# Patient Record
Sex: Male | Born: 1970 | Race: White | Hispanic: No | Marital: Married | Smoking: Never smoker
Health system: Southern US, Community
[De-identification: ages and names within clinical notes are randomized; demographics above are authoritative.]

## PROBLEM LIST (undated history)

## (undated) DIAGNOSIS — M1990 Unspecified osteoarthritis, unspecified site: Secondary | ICD-10-CM

## (undated) DIAGNOSIS — N200 Calculus of kidney: Secondary | ICD-10-CM

## (undated) DIAGNOSIS — T4145XA Adverse effect of unspecified anesthetic, initial encounter: Secondary | ICD-10-CM

---

## 2003-10-06 ENCOUNTER — Inpatient Hospital Stay (HOSPITAL_COMMUNITY): Admission: RE | Admit: 2003-10-06 | Discharge: 2003-10-08 | Payer: Self-pay | Admitting: Orthopaedic Surgery

## 2005-03-28 IMAGING — RF DG KNEE 1-2V*R*
1 series · 1 of 1 positions shown · non-contrast
Comparison: none

CLINICAL DATA: Tibial osteotomy with plate graft.
 RIGHT KNEE
 A single C-arm spot film shows an osteotomy has been performed of the proximal right tibia with plate and screw fixation in good position.
 IMPRESSION
 Proximal right tibial osteotomy with fixation.

[Series 1: run · 1 of 1 slices shown]
[im 1/1]
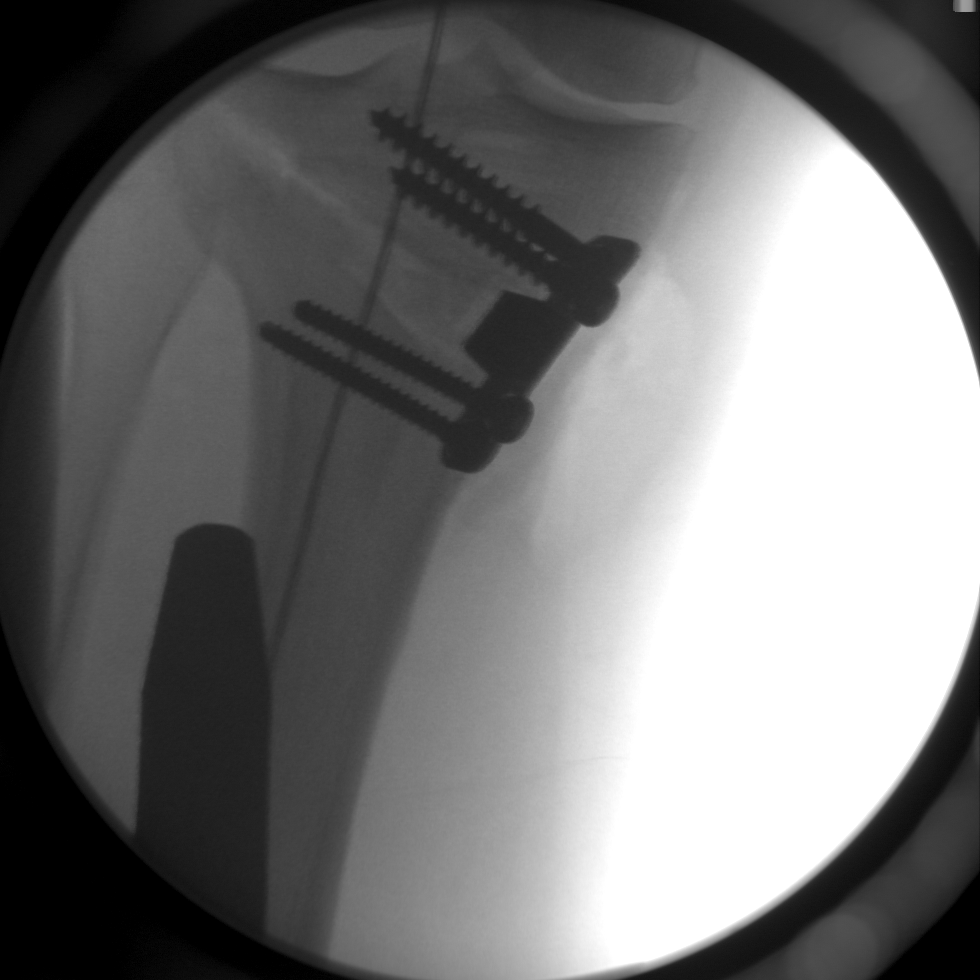

[1 of 1 positions shown; findings below may reference images not displayed]

## 2011-01-01 ENCOUNTER — Other Ambulatory Visit: Payer: Self-pay | Admitting: Orthopedic Surgery

## 2011-01-01 ENCOUNTER — Encounter (HOSPITAL_COMMUNITY): Payer: Worker's Compensation

## 2011-01-01 LAB — URINALYSIS, ROUTINE W REFLEX MICROSCOPIC
Ketones, ur: NEGATIVE mg/dL
Leukocytes, UA: NEGATIVE
Nitrite: NEGATIVE
Protein, ur: NEGATIVE mg/dL
Specific Gravity, Urine: 1.022 (ref 1.005–1.030)

## 2011-01-01 LAB — BASIC METABOLIC PANEL
Calcium: 9.9 mg/dL (ref 8.4–10.5)
Chloride: 101 mEq/L (ref 96–112)
GFR calc Af Amer: >60 mL/min (ref >60)
GFR calc non Af Amer: >60 mL/min (ref >60)
Glucose, Bld: 95 mg/dL (ref 70–99)
Sodium: 137 mEq/L (ref 135–145)

## 2011-01-01 LAB — CBC
HCT: 47 % (ref 39.0–52.0)
Hemoglobin: 16 g/dL (ref 13.0–17.0)
MCH: 29.4 pg (ref 26.0–34.0)
Platelets: 227 10*3/uL (ref 150–400)
RBC: 5.45 MIL/uL (ref 4.22–5.81)
RDW: 13.7 % (ref 11.5–15.5)
WBC: 9.1 10*3/uL (ref 4.0–10.5)

## 2011-01-01 LAB — PROTIME-INR
INR: 0.87 (ref 0.00–1.49)
Prothrombin Time: 12 seconds (ref 11.6–15.2)

## 2011-01-01 LAB — DIFFERENTIAL
Basophils Relative: 0 % (ref 0–1)
Eosinophils Relative: 2 % (ref 0–5)
Lymphocytes Relative: 32 % (ref 12–46)
Lymphs Abs: 2.9 10*3/uL (ref 0.7–4.0)
Monocytes Absolute: 0.5 10*3/uL (ref 0.1–1.0)

## 2011-01-01 LAB — SURGICAL PCR SCREEN: MRSA, PCR: NEGATIVE

## 2011-01-04 NOTE — H&P (Signed)
NAMEKYLEY, LAUREL NO.:  [id number]  MEDICAL RECORD NO.:  [id number]  LOCATION:                                 FACILITY:  PHYSICIAN:  Madlyn Frankel. Charlann Boxer, M.D.  DATE OF BIRTH:  14-Jan-1971  DATE OF ADMISSION: DATE OF DISCHARGE:                             HISTORY & PHYSICAL   DIAGNOSIS:  Right knee osteoarthritis.  HISTORY OF PRESENT ILLNESS:  The patient is a 40 year old white male who states he has had right knee pain for about 3 years.  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  Since that time, he has had significant pain, which has just increased over the years.  The patient does state that he did have previous surgeries on that knee with multiple arthroscopies and wedge is placed in his knee, but he was doing okay after surgeries until the time of the fall.  At the time of the fall, he has had 3-4 arthroscopies, all of which have been minimally effective.  Steroid injections and hyaluronic acid injections also, which were minimally effective and helping the pain.  The patient also states that he has had braces and physical therapy, which all were ineffective.  Various options were discussed with the patient.  The patient wished to proceed with surgery. Risks, benefits, and expectations of the procedure were discussed with the patient.  The patient understands the risks, benefits, and expectations, and wishes to proceed with a right total knee arthroplasty.  The patient is a candidate for tranexamic acid.  The patient has not been given his postop medications and these will be given to him at the time of discharge.  PAST MEDICAL HISTORY:  The patient denies any past medical history.  PAST SURGICAL HISTORY: 1. Surgery to remove kidney stones. 2. Multiple surgeries on his right knee.  MEDICATIONS:  The patient denies taking any medications.  ALLERGIES:  The patient denies any drug allergies.   The patient does, however, had an allergy to PEANUTS.  SOCIAL HISTORY:  The patient denies the use of alcohol or tobacco.  REVIEW OF SYSTEMS:  The patient does complain of joint pain, joint swelling, muscle pain, and morning stiffness, otherwise unremarkable.  PHYSICAL EXAMINATION:  GENERAL:  The patient is a 40 year old white male in no acute distress. VITAL SIGNS:  Stable.  Blood pressure 130/86, respiration 16, pulse 80. HEENT:  Pupils equal, round, and reactive to light and accommodation. Throat is clear. NECK:  Supple.  No JVD.  No carotid bruits.  No lymphadenopathy. CARDIAC:  Normal appearing S1 and S2.  No murmur appreciated. RESPIRATORY:  Clear to auscultation bilaterally. NEURO:  The patient is oriented x3. ORTHO:  Pertaining to the right knee, the patient does have generalized right knee pain with palpation.  The patient does have pain with range of motion.  The patient has had full range of motion, but this is painful.  The patient has a +2 dorsalis pedis pulse.  The patient is distally neurovascularly intact.  Surgical site looks clear.  IMPRESSION:  Right knee osteoarthritis.  STUDIES:  X-rays of the right knee reveal  significant osteoarthritis of the right knee in all compartments.  PLAN:  The patient admitted to the hospital to undergo a right total knee arthroplasty.  Risks, benefits, and expectations of the procedure were discussed with the patient.  The patient understands the risks, benefits, and expectations and wishes to proceed.    ______________________________ Lanney Gins, PA   ______________________________ Madlyn Frankel. Charlann Boxer, M.D.    MB/MEDQ  D:  01/01/2011  T:  01/02/2011  Job:  [id number]  Electronically Signed by Lanney Gins PA on 01/02/2011 03:58:26 PM Electronically Signed by Durene Romans M.D. on 01/04/2011 09:21:49 AM

## 2011-01-14 ENCOUNTER — Inpatient Hospital Stay (HOSPITAL_COMMUNITY)
Admission: RE | Admit: 2011-01-14 | Discharge: 2011-01-16 | DRG: 470 | Disposition: A | Discharge status: Home Health | Payer: Worker's Compensation | Source: Ambulatory Visit | Attending: Orthopedic Surgery | Admitting: Orthopedic Surgery

## 2011-01-14 DIAGNOSIS — Z87442 Personal history of urinary calculi: Secondary | ICD-10-CM

## 2011-01-14 DIAGNOSIS — Z01812 Encounter for preprocedural laboratory examination: Secondary | ICD-10-CM

## 2011-01-14 DIAGNOSIS — Z91010 Allergy to peanuts: Secondary | ICD-10-CM

## 2011-01-14 DIAGNOSIS — G478 Other sleep disorders: Secondary | ICD-10-CM | POA: Diagnosis not present

## 2011-01-14 DIAGNOSIS — M1710 Unilateral primary osteoarthritis, unspecified knee: Principal | ICD-10-CM | POA: Diagnosis present

## 2011-01-15 LAB — CBC
HCT: 38.4 % — ABNORMAL LOW (ref 39.0–52.0)
Hemoglobin: 12.6 g/dL — ABNORMAL LOW (ref 13.0–17.0)
MCH: 29 pg (ref 26.0–34.0)
MCV: 88.3 fL (ref 78.0–100.0)
Platelets: 180 10*3/uL (ref 150–400)
RBC: 4.35 MIL/uL (ref 4.22–5.81)
RDW: 13.5 % (ref 11.5–15.5)
WBC: 9.2 10*3/uL (ref 4.0–10.5)

## 2011-01-15 LAB — BASIC METABOLIC PANEL
BUN: 10 mg/dL (ref 6–23)
GFR calc Af Amer: >60 mL/min (ref >60)
GFR calc non Af Amer: >60 mL/min (ref >60)
Glucose, Bld: 103 mg/dL — ABNORMAL HIGH (ref 70–99)

## 2011-01-15 NOTE — Op Note (Signed)
NAMERHETT, MUTSCHLER NO.:  [id number]  MEDICAL RECORD NO.:  [id number]  LOCATION:  1611                         FACILITY:  [facility]  PHYSICIAN:  Dennis Frankel. Charlann Arellano, M.D.  DATE OF BIRTH:  [birth year]  DATE OF PROCEDURE:  01/14/2011 DATE OF DISCHARGE:                              OPERATIVE REPORT   PREOPERATIVE DIAGNOSIS:  Right knee osteoarthritis.  POSTOPERATIVE DIAGNOSIS:  Right knee osteoarthritis.  PROCEDURE:  Right total knee replacement utilizing DePuy component size #4 narrow femur, #3 tibia, 10 mm insert, and 38 patellar button.  SURGEON:  Dennis Frankel. Charlann Arellano, M.D.  ASSISTANT:  Lanney Gins, PA-C  ANESTHESIA:  General plus a preoperative regional femoral nerve blocks.  COMPLICATIONS:  None.  DRAINS:  One Hemovac.  SPECIMENS:  None.  TOURNIQUET TIME:  42 minutes with 250 mmHg.  INDICATIONS:  Dennis Arellano is a 40 year old male patient of mine since he injured his knee from a workers' comp standpoint.  He has undergone, atthis point, three unsuccessful surgeries of arthroscopic scar debridement and then assessed knee as well as removal of hardware.  He has had persistent recurring problems with his knee that has limited his overall ability to function and perform his job, but also to deal with life.  At this point, based on his persistence and recurrence of problems, he wished to proceed with more definitive measures, knee replacement versus partial knee replacement discussed and he wished to proceed with more definitive measures of treatment, total knee replacement.  Risks and benefits were discussed and the risk of infection, DVT, component failure, need for revision surgery were all discussed, particularly with his age and activity level.  Consent was obtained for benefit of pain relief.  PROCEDURE IN DETAIL:  The patient was brought to operative theater. Once adequate anesthesia, preoperative antibiotics, Ancef administered, the patient was placed in  supine with the  right thigh tourniquet placed.  The right lower extremity was then prepped and draped in sterile fashion.  His right foot placed in leg holder.  The leg was exsanguinated, tourniquet elevated 250 mmHg.  A midline incision was made followed by median arthrotomy.  Following initial exposure and debridement, attention was first directed to patella, precut measure was noted to be 26 mm, was taken down to 15 mm.  Used a 38 patellar button to restore height and cover the cut surface best. Leg holes were drilled and metal shim placed.  Attention was now directed to femur, femoral canals were opened with a drill, irrigated to try to prevent fat emboli.  The intramedullary rod was passed at 3 degrees of valgus, 10 mm bone resected off the distal femur.  Following this resection, the proximal tibia was subluxated anteriorly.  I removed remaining meniscus medially.  The lateral meniscus and cruciate stump using extramedullary guide and measured resection of 10 mm of the proximal lateral tibia was carried out.  The basis of the cut based on previous opening wedge medial osteotomy.  Following this cut, we confirmed the gap was stable medially and laterally with 10 mm insert as well as confirmed that the cut was perpendicular in the coronal plane, checking the alignment rod.  Once  those two sides were done, I sized the femur, to be a size #4 from anterior-posterior dimension.  The size #4 block was then pinned in position anterior reference due to the sequence of rotation of proximal tibial cut.  The four-in-one cutting block was then pinned into position and anterior- posterior chamfer cuts made without difficulty nor notching.  The final box cut was made off the lateral aspect of the distal femur.  Once this was done and synovectomy performed in the anterior aspect of the knee, as I was removing this bone fragments, attention was now directed to tibia.  The tibia subluxated  anteriorly and a size #3 tibial tray seemed to fit best on the medial osteophytes and little bit of bony overgrowth.  The size #3 trial was then pinned into position, drilled and keel punched.  Trial reduction now carried out with 4 narrow femur, 3 tibia, and a 10-mm insert.  With this the knee came to full extension and was stable from extension to flexion using the 10 insert.  The patella tracked through the trochlea withou tapplicatyion of pressure.  Given all these findings, the trial components were removed.  The final components were opened.  The knee was irrigated with normal saline solution and pulse lavaged.  The synovial capsule junction knee was injected with 0.25% Marcaine with Epinephrine, 1 mL of Toradol, total of 61 mL.  Final components were then cemented and completely dried, cut surfaces of bone.  The knee was brought to extension with 10 mL insert.  The knee was held in extension until the cement fully cured.  Excessive cement was removed. Once cement had fully cured and excess cement was removed throughout the knee, the final 10 mm insert was chosen and placed in the knee.  The tourniquet was let down after 42 minutes with excellent hemostasis.  At this point, Hemovac drain was placed deep.  We re-irrigated the knee. We reapproximated the extensor mechanism using #1 Vicryl with knee in flexion.  The remaining wound was closed with 2-0 Vicryl and running 4-0 Monocryl.  The knee was cleaned, dried, dressed sterilely using Dermabond and after sterile dressing, drain site dressed separately. The knee was wrapped in Ace wrap.  He was then brought to recovery room, extubated in stable condition, tolerating the procedure well.     Dennis Arellano, M.D.     MDO/MEDQ  D:  01/14/2011  T:  01/15/2011  Job:  [id number]  Electronically Signed by Durene Romans M.D. on 01/15/2011 01:54:10 PM

## 2011-01-16 LAB — CBC
HCT: 37 % — ABNORMAL LOW (ref 39.0–52.0)
MCHC: 33.2 g/dL (ref 30.0–36.0)
MCV: 87.7 fL (ref 78.0–100.0)
Platelets: 163 10*3/uL (ref 150–400)
RBC: 4.22 MIL/uL (ref 4.22–5.81)
WBC: 8.1 10*3/uL (ref 4.0–10.5)

## 2011-01-16 LAB — BASIC METABOLIC PANEL
CO2: 28 mEq/L (ref 19–32)
Calcium: 8.7 mg/dL (ref 8.4–10.5)
Creatinine, Ser: 0.82 mg/dL (ref 0.50–1.35)
GFR calc Af Amer: >60 mL/min (ref >60)

## 2011-01-17 NOTE — Discharge Summary (Signed)
NAMEZAYDENN, BALAGUER NO.:  [id number]  MEDICAL RECORD NO.:  [id number]  LOCATION:  1611                         FACILITY:  [facility]  PHYSICIAN:  Madlyn Frankel. Charlann Boxer, M.D.  DATE OF BIRTH:  1971/04/24  DATE OF ADMISSION:  01/14/2011 DATE OF DISCHARGE:  01/16/2011                              DISCHARGE SUMMARY   PROCEDURE:  Right total knee arthroplasty.  ADMITTING DIAGNOSIS:  Right knee osteoarthritis.  DISCHARGE DIAGNOSIS:  Status post right total knee arthroplasty.  HISTORY OF PRESENT ILLNESS:  The patient is a 40 year old white male, who states he has had right knee pain for about 3 years.  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  Since that time, he has significant pain, which has just increased over the years.  The patient does state that he did have previous surgeries on that knee with multiple arthroscopies in a wedge is placed in his knee.  He was doing okay after surgery until the time of the fall.  Since the fall, he has had 3 or 4 arthroscopies, all of which have been minimal effective in controlling the pain.  The patient has had steroid injection, hyaluronic acid injections as well, which again have only been minimally effective in helping the pain.  The patient has used braces and had physical therapy in the past, all of which have not helped.  Various options were discussed with the patient. The patient wishes to proceed with surgery.  Risks, benefits, and expectations of the procedure have been discussed and thus the patient understands the risks, benefits and expectations  and wished to proceed with surgery.  HOSPITAL COURSE:  The patient underwent the above-stated procedure on January 14, 2011.  The patient tolerated the procedure well, was brought to the recovery room in good condition and subsequently to the floor.  Postop day 1, January 15, 2011, the patient is doing well.  No  real incidences.  The patient did have difficulty sleeping.  The patient also did have a hard time with the pain, which he was given some Toradol on top of his pain medications for.  The patient is afebrile, Vital signs are stable.  H and H 12.6/38.4 .  He is distally neurovascularly intact. Dressings clean, dry and intact.  Hemovac was removed.  The patient had physical therapy.  On postop day 2, January 16, 2011, the patient was much better at night, sleeping well.  Pain is controlled well, was afebrile, vital signs stable.  H and H is 12.3/37.0.  He is distally neurovascularly intact. The Ace was removed.  Dressing is clean, dry, and intact.  Did physical therapy.  The patient was felt to be doing well enough to be discharged home.  DISCHARGE CONDITION:  Good.  DISCHARGE INSTRUCTIONS:  The patient will be discharged home on January 16, 2011.  He will be weightbearing as tolerated.  The patient maintained surgical dressing for 8 days after which he will replace the dressing with gauze and tape.  He will keep the area dry and clean until followup.  The patient will follow up with Dr.  Treyce Spillers at [facility] in 2 weeks.  The patient is to call if any concerns or questions.  DISCHARGE MEDICATIONS: 1. Aspirin enteric-coated 325 mg one p.o. b.i.d. x4 weeks. 2. Celebrex 200 mg one p.o. b.i.d. a month. 3. Benadryl 25 mg one p.o. q.4 h. p.r.n. 4. Colace 100 mg one p.o. b.i.d. p.r.n. constipation. 5. Iron sulfate 325 mg one p.o. t.i.d. 6. Norco 7.5/325 one or two p.o. q.4-6 h. p.r.n. pain. 7. MiraLax 17 g p.o. q. day p.r.n. constipation. 8. Robaxin 500 mg one p.o. q.6 h.    ______________________________ Lanney Gins, PA   ______________________________ Madlyn Frankel. Charlann Boxer, M.D.    MB/MEDQ  D:  01/16/2011  T:  01/16/2011  Job:  [id number]  Electronically Signed by Lanney Gins PA on 01/16/2011 05:29:32 PM Electronically Signed by Durene Romans M.D. on 01/17/2011 08:58:56  PM

## 2011-06-05 ENCOUNTER — Encounter (HOSPITAL_COMMUNITY): Payer: Self-pay | Admitting: Pharmacy Technician

## 2011-06-19 ENCOUNTER — Encounter (HOSPITAL_COMMUNITY): Payer: Self-pay

## 2011-06-19 ENCOUNTER — Encounter (HOSPITAL_COMMUNITY)
Admission: RE | Admit: 2011-06-19 | Discharge: 2011-06-19 | Disposition: A | Discharge status: Home / Self Care | Payer: Worker's Compensation | Source: Ambulatory Visit | Attending: Orthopedic Surgery | Admitting: Orthopedic Surgery

## 2011-06-19 DIAGNOSIS — M1990 Unspecified osteoarthritis, unspecified site: Secondary | ICD-10-CM

## 2011-06-19 DIAGNOSIS — T8859XA Other complications of anesthesia, initial encounter: Secondary | ICD-10-CM

## 2011-06-19 DIAGNOSIS — N200 Calculus of kidney: Secondary | ICD-10-CM

## 2011-06-19 HISTORY — DX: Other complications of anesthesia, initial encounter: T88.59XA

## 2011-06-19 HISTORY — DX: Adverse effect of unspecified anesthetic, initial encounter: T41.45XA

## 2011-06-19 HISTORY — PX: KNEE ARTHROSCOPY: SHX127

## 2011-06-19 HISTORY — PX: JOINT REPLACEMENT: SHX530

## 2011-06-19 HISTORY — DX: Calculus of kidney: N20.0

## 2011-06-19 HISTORY — DX: Unspecified osteoarthritis, unspecified site: M19.90

## 2011-06-19 HISTORY — PX: WISDOM TOOTH EXTRACTION: SHX21

## 2011-06-19 LAB — BASIC METABOLIC PANEL
BUN: 10 mg/dL (ref 6–23)
CO2: 27 mEq/L (ref 19–32)
Chloride: 100 mEq/L (ref 96–112)
GFR calc Af Amer: >90 mL/min (ref >90)
Glucose, Bld: 99 mg/dL (ref 70–99)
Potassium: 4.3 mEq/L (ref 3.5–5.1)

## 2011-06-19 LAB — CBC
HCT: 46.5 % (ref 39.0–52.0)
Hemoglobin: 15.9 g/dL (ref 13.0–17.0)
MCH: 28.6 pg (ref 26.0–34.0)
MCHC: 34.2 g/dL (ref 30.0–36.0)
MCV: 83.8 fL (ref 78.0–100.0)

## 2011-06-19 LAB — DIFFERENTIAL
Basophils Relative: 0 % (ref 0–1)
Eosinophils Relative: 3 % (ref 0–5)
Monocytes Absolute: 0.4 10*3/uL (ref 0.1–1.0)
Monocytes Relative: 4 % (ref 3–12)
Neutro Abs: 5.2 10*3/uL (ref 1.7–7.7)

## 2011-06-19 LAB — URINALYSIS, ROUTINE W REFLEX MICROSCOPIC
Glucose, UA: NEGATIVE mg/dL
Ketones, ur: NEGATIVE mg/dL
Leukocytes, UA: NEGATIVE
pH: 7 (ref 5.0–8.0)

## 2011-06-19 LAB — SURGICAL PCR SCREEN: MRSA, PCR: NEGATIVE

## 2011-06-19 MED ORDER — CHLORHEXIDINE GLUCONATE 4 % EX LIQD
60.0000 mL | Freq: Once | CUTANEOUS | Status: DC
  Filled 2011-06-19: qty 60

## 2011-06-19 NOTE — Patient Instructions (Signed)
20 VITTORIO MOHS  06/19/2011   Your procedure is scheduled on: 06-23-11  Report to [facility] at 0745 AM.  Call this number if you have problems the morning of surgery: [phone]   Remember:   Do not eat food:After Midnight.  May have clear liquids:until Midnight .  Clear liquids include soda, tea, black coffee, apple or grape juice, broth.  Take these medicines the morning of surgery with A SIP OF WATER: none   Do not wear jewelry, make-up or nail polish.  Do not wear lotions, powders, or perfumes. You may wear deodorant.  Do not shave 48 hours prior to surgery.  Do not bring valuables to the hospital.  Contacts, dentures or bridgework may not be worn into surgery.  Leave suitcase in the car. After surgery it may be brought to your room.  For patients admitted to the hospital, checkout time is 11:00 AM the day of discharge.   Patients discharged the day of surgery will not be allowed to drive home.  Name and phone number of your driver: [phone]  Special Instructions: Incentive Spirometry - Practice and bring it with you on the day of surgery. and CHG Shower Use Special Wash: 1/2 bottle night before surgery and 1/2 bottle morning of surgery.   Please read over the following fact sheets that you were given: Blood Transfusion Information and MRSA Information

## 2011-06-19 NOTE — H&P (Signed)
Dennis Arellano is an 41 y.o. male.    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  Since that time, he has had significant pain, which has just  increased over the years.  The patient does state that he did have previous surgeries on that knee with multiple arthroscopies and wedge placement in his knee.  After the time of the fall, he has had 3-4 arthroscopies, all of which have been minimally effective.  Steroid injections and hyaluronic acid injections also, which were minimally effective at helping the pain.  The patient also states that he has had braces and physical therapy.  Since all the conservative treatements were ineffective he had a right total knee arthroplasty a couple of month ago. He was doing well at first, but gradually started to have a mechanical click in the knee and started to have some pain on the lateral aspect. Various options were  discussed with the patient.  The patient wished to proceed with surgery.  Risks, benefits, and expectations of the procedure were discussed with  the patient.  The patient understands the risks, benefits, and expectations, and wishes to proceed with a right total knee revision, with open arthrotomy, poly exchange and scar removal/debridment.  PCP:  No primary provider on file.  D/C Plans:  Home with HHPT  Post-op Meds:  Rx given for ASA, Robaxin, Iron, Colace and MiraLax  Tranexamic Acid:  To be given  PMH: Past Medical History  Diagnosis Date  . Complication of anesthesia 06-19-11    8'12 surgery -hard time to arouse  . Kidney calculi 06-19-11    past hx.  . Arthritis 06-19-11    8'12 RTKA, now with  ?scarring    PSH: Past Surgical History  Procedure Date  . Joint replacement 06-19-11    8'12 RTKA, multiple knee  scopes prior  . Knee arthroscopy 06-19-11    Right -multiple ,one with bone wedge procedure  . Wisdom tooth extraction 06-19-11    Social History:  reports that he has never smoked. He does not have any smokeless tobacco history on file. He reports that he does not drink alcohol or use illicit drugs.  Allergies:  Allergies  Allergen Reactions  . Peanut-Containing Drug Products Anaphylaxis    Medications: None   ROS: Review of Systems  Constitutional: Negative.   HENT: Negative.   Eyes: Negative.   Respiratory: Negative.   Cardiovascular: Negative.   Gastrointestinal: Negative.   Genitourinary: Negative.   Musculoskeletal: Positive for joint pain (right knee).  Skin: Negative.   Neurological: Negative.   Endo/Heme/Allergies: Negative.   Psychiatric/Behavioral: Negative.      Physican Exam: Blood pressure 136/80, respiration 16, pulse 80. Physical Exam  Constitutional: He is oriented to person, place, and time and well-developed, well-nourished, and in no distress.  HENT:  Head: Normocephalic and atraumatic.  Nose: Nose normal.  Mouth/Throat: Oropharynx is clear and moist.  Eyes: Pupils are equal, round, and reactive to light.  Neck: Normal range of motion. Neck supple. No JVD present. No tracheal deviation present. No thyromegaly present.  Cardiovascular: Normal rate, regular rhythm, normal heart sounds and intact distal pulses.  Exam reveals no gallop and no friction rub.   No murmur heard. Pulmonary/Chest: Effort normal  and breath sounds normal. No stridor. No respiratory distress. He has no wheezes. He has no rales. He exhibits no tenderness.  Abdominal: Soft. There is no tenderness. There is no guarding.  Musculoskeletal:       Right knee: He exhibits laceration (healed from previous surgery). He exhibits normal range of motion, no swelling, no effusion, no ecchymosis, no deformity, no erythema, normal alignment and no LCL laxity. tenderness found. Lateral joint  line tenderness noted. No patellar tendon tenderness noted.  Lymphadenopathy:    He has no cervical adenopathy.  Neurological: He is alert and oriented to person, place, and time.  Skin: Skin is warm and dry. No rash noted. No erythema. No pallor.  Psychiatric: Affect normal.    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 Risks benefits and expectation were discussed with the patient. Patient understand risks, benefits and expectation and wishes to proceed.   Anastasio Auerbach Dennis Arellano   PAC  06/22/2011, 10:27 AM

## 2011-06-19 NOTE — Pre-Procedure Instructions (Signed)
06-19-11 CT Chest (07-19-10) report with chart.

## 2011-06-23 ENCOUNTER — Inpatient Hospital Stay (HOSPITAL_COMMUNITY)
Admission: RE | Admit: 2011-06-23 | Discharge: 2011-06-25 | DRG: 489 | Disposition: A | Discharge status: Home Health | Payer: Worker's Compensation | Source: Ambulatory Visit | Attending: Orthopedic Surgery | Admitting: Orthopedic Surgery

## 2011-06-23 ENCOUNTER — Encounter (HOSPITAL_COMMUNITY): Payer: Self-pay | Admitting: Anesthesiology

## 2011-06-23 ENCOUNTER — Encounter (HOSPITAL_COMMUNITY): Payer: Self-pay | Admitting: *Deleted

## 2011-06-23 ENCOUNTER — Encounter (HOSPITAL_COMMUNITY)
Admission: RE | Disposition: A | Discharge status: Home Health | Payer: Self-pay | Source: Ambulatory Visit | Attending: Orthopedic Surgery

## 2011-06-23 ENCOUNTER — Inpatient Hospital Stay (HOSPITAL_COMMUNITY): Payer: Worker's Compensation | Admitting: Anesthesiology

## 2011-06-23 DIAGNOSIS — Z96659 Presence of unspecified artificial knee joint: Secondary | ICD-10-CM

## 2011-06-23 DIAGNOSIS — Y831 Surgical operation with implant of artificial internal device as the cause of abnormal reaction of the patient, or of later complication, without mention of misadventure at the time of the procedure: Secondary | ICD-10-CM | POA: Diagnosis present

## 2011-06-23 DIAGNOSIS — M23302 Other meniscus derangements, unspecified lateral meniscus, unspecified knee: Secondary | ICD-10-CM | POA: Diagnosis present

## 2011-06-23 DIAGNOSIS — T84039A Mechanical loosening of unspecified internal prosthetic joint, initial encounter: Principal | ICD-10-CM | POA: Diagnosis present

## 2011-06-23 DIAGNOSIS — Z91010 Allergy to peanuts: Secondary | ICD-10-CM

## 2011-06-23 DIAGNOSIS — Z01812 Encounter for preprocedural laboratory examination: Secondary | ICD-10-CM

## 2011-06-23 HISTORY — PX: I&D KNEE WITH POLY EXCHANGE: SHX5024

## 2011-06-23 LAB — TYPE AND SCREEN: ABO/RH(D): A POS

## 2011-06-23 SURGERY — IRRIGATION AND DEBRIDEMENT KNEE WITH POLY EXCHANGE
Anesthesia: General | Site: Knee | Laterality: Right | Wound class: Clean

## 2011-06-23 MED ORDER — ROCURONIUM BROMIDE 100 MG/10ML IV SOLN
INTRAVENOUS | Status: DC | PRN
  Administered 2011-06-23: 50 mg via INTRAVENOUS

## 2011-06-23 MED ORDER — ACETAMINOPHEN 650 MG RE SUPP: 650.0000 mg | Freq: Four times a day (QID) | RECTAL | Status: DC | PRN

## 2011-06-23 MED ORDER — TRANEXAMIC ACID 100 MG/ML IV SOLN
1860.0000 mg | INTRAVENOUS | Status: AC
  Administered 2011-06-23: 10:00:00 via INTRAVENOUS
  Administered 2011-06-23: 1850 mg via INTRAVENOUS
  Filled 2011-06-23: qty 18.6

## 2011-06-23 MED ORDER — PHENOL 1.4 % MT LIQD: 1.0000 | OROMUCOSAL | Status: DC | PRN

## 2011-06-23 MED ORDER — METOCLOPRAMIDE HCL 10 MG PO TABS: 5.0000 mg | ORAL_TABLET | Freq: Three times a day (TID) | ORAL | Status: DC | PRN

## 2011-06-23 MED ORDER — HYDROMORPHONE HCL PF 1 MG/ML IJ SOLN: 0.2500 mg | INTRAMUSCULAR | Status: DC | PRN

## 2011-06-23 MED ORDER — HYDROMORPHONE HCL PF 1 MG/ML IJ SOLN
0.5000 mg | INTRAMUSCULAR | Status: DC | PRN
  Administered 2011-06-23: 0.5 mg via INTRAVENOUS
  Administered 2011-06-23: 1 mg via INTRAVENOUS
  Administered 2011-06-23: 0.5 mg via INTRAVENOUS
  Administered 2011-06-24: 1 mg via INTRAVENOUS
  Filled 2011-06-23 (×5): qty 1

## 2011-06-23 MED ORDER — METHOCARBAMOL 500 MG PO TABS
500.0000 mg | ORAL_TABLET | Freq: Four times a day (QID) | ORAL | Status: DC | PRN
  Administered 2011-06-24 (×3): 500 mg via ORAL
  Filled 2011-06-23 (×3): qty 1

## 2011-06-23 MED ORDER — ONDANSETRON HCL 4 MG/2ML IJ SOLN
INTRAMUSCULAR | Status: DC | PRN
  Administered 2011-06-23: 4 mg via INTRAVENOUS

## 2011-06-23 MED ORDER — CEFAZOLIN SODIUM-DEXTROSE 2-3 GM-% IV SOLR
2.0000 g | Freq: Once | INTRAVENOUS | Status: AC
  Administered 2011-06-23: 2 g via INTRAVENOUS

## 2011-06-23 MED ORDER — CEFAZOLIN SODIUM 1-5 GM-% IV SOLN
1.0000 g | Freq: Four times a day (QID) | INTRAVENOUS | Status: AC
  Administered 2011-06-23 – 2011-06-24 (×3): 1 g via INTRAVENOUS
  Filled 2011-06-23 (×3): qty 50

## 2011-06-23 MED ORDER — PROPOFOL 10 MG/ML IV BOLUS
INTRAVENOUS | Status: DC | PRN
  Administered 2011-06-23: 230 mg via INTRAVENOUS
  Administered 2011-06-23: 50 mg via INTRAVENOUS

## 2011-06-23 MED ORDER — KETOROLAC TROMETHAMINE 30 MG/ML IJ SOLN
INTRAMUSCULAR | Status: DC | PRN
  Administered 2011-06-23: 30 mg via INTRAVENOUS

## 2011-06-23 MED ORDER — LACTATED RINGERS IV SOLN
INTRAVENOUS | Status: DC
  Administered 2011-06-23: 100 mL/h via INTRAVENOUS
  Administered 2011-06-23: 1000 mL via INTRAVENOUS

## 2011-06-23 MED ORDER — LIDOCAINE HCL (CARDIAC) 20 MG/ML IV SOLN
INTRAVENOUS | Status: DC | PRN
  Administered 2011-06-23: 50 mg via INTRAVENOUS

## 2011-06-23 MED ORDER — ONDANSETRON HCL 4 MG PO TABS: 4.0000 mg | ORAL_TABLET | Freq: Four times a day (QID) | ORAL | Status: DC | PRN

## 2011-06-23 MED ORDER — ACETAMINOPHEN 10 MG/ML IV SOLN
INTRAVENOUS | Status: DC | PRN
  Administered 2011-06-23: 1000 mg via INTRAVENOUS

## 2011-06-23 MED ORDER — GLYCOPYRROLATE 0.2 MG/ML IJ SOLN
INTRAMUSCULAR | Status: DC | PRN
  Administered 2011-06-23: .5 mg via INTRAVENOUS

## 2011-06-23 MED ORDER — ONDANSETRON HCL 4 MG/2ML IJ SOLN: 4.0000 mg | Freq: Four times a day (QID) | INTRAMUSCULAR | Status: DC | PRN

## 2011-06-23 MED ORDER — LACTATED RINGERS IV SOLN
INTRAVENOUS | Status: DC | PRN
  Administered 2011-06-23 (×2): via INTRAVENOUS

## 2011-06-23 MED ORDER — MENTHOL 3 MG MT LOZG
1.0000 | LOZENGE | OROMUCOSAL | Status: DC | PRN
  Filled 2011-06-23: qty 9

## 2011-06-23 MED ORDER — HYDROCODONE-ACETAMINOPHEN 7.5-325 MG PO TABS
1.0000 | ORAL_TABLET | ORAL | Status: DC | PRN
  Administered 2011-06-23: 2 via ORAL
  Administered 2011-06-23 – 2011-06-25 (×3): 1 via ORAL
  Filled 2011-06-23: qty 2
  Filled 2011-06-23 (×3): qty 1

## 2011-06-23 MED ORDER — FENTANYL CITRATE 0.05 MG/ML IJ SOLN
50.0000 ug | INTRAMUSCULAR | Status: DC | PRN
  Administered 2011-06-23: 100 ug via INTRAVENOUS

## 2011-06-23 MED ORDER — DEXAMETHASONE SODIUM PHOSPHATE 10 MG/ML IJ SOLN
10.0000 mg | Freq: Once | INTRAMUSCULAR | Status: DC
  Filled 2011-06-23: qty 1

## 2011-06-23 MED ORDER — BUPIVACAINE-EPINEPHRINE PF 0.25-1:200000 % IJ SOLN
INTRAMUSCULAR | Status: DC | PRN
  Administered 2011-06-23: 30 mL

## 2011-06-23 MED ORDER — ALUM & MAG HYDROXIDE-SIMETH 200-200-20 MG/5ML PO SUSP: 30.0000 mL | ORAL | Status: DC | PRN

## 2011-06-23 MED ORDER — ACETAMINOPHEN 325 MG PO TABS: 650.0000 mg | ORAL_TABLET | Freq: Four times a day (QID) | ORAL | Status: DC | PRN

## 2011-06-23 MED ORDER — PROMETHAZINE HCL 25 MG/ML IJ SOLN: 6.2500 mg | INTRAMUSCULAR | Status: DC | PRN

## 2011-06-23 MED ORDER — ROPIVACAINE HCL 5 MG/ML IJ SOLN
INTRAMUSCULAR | Status: DC | PRN
  Administered 2011-06-23: 25 mL via EPIDURAL

## 2011-06-23 MED ORDER — SUFENTANIL CITRATE 50 MCG/ML IV SOLN
INTRAVENOUS | Status: DC | PRN
  Administered 2011-06-23 (×4): 10 ug via INTRAVENOUS

## 2011-06-23 MED ORDER — RIVAROXABAN 10 MG PO TABS
10.0000 mg | ORAL_TABLET | Freq: Every day | ORAL | Status: DC
  Administered 2011-06-24 – 2011-06-25 (×2): 10 mg via ORAL
  Filled 2011-06-23 (×2): qty 1

## 2011-06-23 MED ORDER — METHOCARBAMOL 100 MG/ML IJ SOLN
500.0000 mg | Freq: Four times a day (QID) | INTRAVENOUS | Status: DC | PRN
  Administered 2011-06-23: 500 mg via INTRAVENOUS
  Filled 2011-06-23: qty 5

## 2011-06-23 MED ORDER — NEOSTIGMINE METHYLSULFATE 1 MG/ML IJ SOLN
INTRAMUSCULAR | Status: DC | PRN
  Administered 2011-06-23: 4 mg via INTRAVENOUS

## 2011-06-23 MED ORDER — MIDAZOLAM HCL 2 MG/2ML IJ SOLN
1.0000 mg | INTRAMUSCULAR | Status: DC | PRN
  Administered 2011-06-23: 2 mg via INTRAVENOUS

## 2011-06-23 MED ORDER — METOCLOPRAMIDE HCL 5 MG/ML IJ SOLN: 5.0000 mg | Freq: Three times a day (TID) | INTRAMUSCULAR | Status: DC | PRN

## 2011-06-23 MED ORDER — POLYETHYLENE GLYCOL 3350 17 G PO PACK
17.0000 g | PACK | Freq: Every day | ORAL | Status: DC | PRN
  Filled 2011-06-23: qty 1

## 2011-06-23 MED ORDER — SODIUM CHLORIDE 0.9 % IV SOLN
INTRAVENOUS | Status: DC
  Administered 2011-06-23 – 2011-06-24 (×2): via INTRAVENOUS
  Filled 2011-06-23 (×7): qty 1000

## 2011-06-23 MED ORDER — DOCUSATE SODIUM 100 MG PO CAPS
100.0000 mg | ORAL_CAPSULE | Freq: Two times a day (BID) | ORAL | Status: DC
  Administered 2011-06-23 – 2011-06-25 (×4): 100 mg via ORAL
  Filled 2011-06-23 (×5): qty 1

## 2011-06-23 MED ORDER — DIPHENHYDRAMINE HCL 12.5 MG/5ML PO ELIX: 12.5000 mg | ORAL_SOLUTION | ORAL | Status: DC | PRN

## 2011-06-23 SURGICAL SUPPLY — 71 items
BAG ZIPLOCK 12X15 (MISCELLANEOUS) ×2 IMPLANT
BANDAGE ELASTIC 4 VELCRO ST LF (GAUZE/BANDAGES/DRESSINGS) ×2 IMPLANT
BANDAGE ELASTIC 6 VELCRO ST LF (GAUZE/BANDAGES/DRESSINGS) ×2 IMPLANT
BANDAGE ESMARK 6X9 LF (GAUZE/BANDAGES/DRESSINGS) ×1 IMPLANT
BLADE SAW SGTL 13.0X1.19X90.0M (BLADE) ×2 IMPLANT
BLADE SAW SGTL 81X20 HD (BLADE) ×2 IMPLANT
BNDG ESMARK 6X9 LF (GAUZE/BANDAGES/DRESSINGS) ×2
BONE CEMENT GENTAMICIN (Cement) ×2 IMPLANT
CEMENT BONE GENTAMICIN 40 (Cement) ×1 IMPLANT
CEMENT RESTRICTOR DEPUY SZ 4 (Cement) ×2 IMPLANT
CLOTH BEACON ORANGE TIMEOUT ST (SAFETY) ×2 IMPLANT
CONT SPECI 4OZ STER CLIK (MISCELLANEOUS) ×2 IMPLANT
CUFF TOURN SGL QUICK 34 (TOURNIQUET CUFF) ×1
CUFF TRNQT CYL 34X4X40X1 (TOURNIQUET CUFF) ×1 IMPLANT
DECANTER SPIKE VIAL GLASS SM (MISCELLANEOUS) ×2 IMPLANT
DERMABOND ADVANCED (GAUZE/BANDAGES/DRESSINGS) ×1
DERMABOND ADVANCED .7 DNX12 (GAUZE/BANDAGES/DRESSINGS) ×1 IMPLANT
DRAPE EXTREMITY T 121X128X90 (DRAPE) ×2 IMPLANT
DRAPE POUCH INSTRU U-SHP 10X18 (DRAPES) ×2 IMPLANT
DRAPE U-SHAPE 47X51 STRL (DRAPES) ×2 IMPLANT
DRSG ADAPTIC 3X8 NADH LF (GAUZE/BANDAGES/DRESSINGS) ×2 IMPLANT
DRSG AQUACEL AG ADV 3.5X10 (GAUZE/BANDAGES/DRESSINGS) ×2 IMPLANT
DRSG PAD ABDOMINAL 8X10 ST (GAUZE/BANDAGES/DRESSINGS) ×4 IMPLANT
DRSG TEGADERM 4X4.75 (GAUZE/BANDAGES/DRESSINGS) ×2 IMPLANT
DURAPREP 26ML APPLICATOR (WOUND CARE) ×2 IMPLANT
ELECT REM PT RETURN 9FT ADLT (ELECTROSURGICAL) ×2
ELECTRODE REM PT RTRN 9FT ADLT (ELECTROSURGICAL) ×1 IMPLANT
EVACUATOR 1/8 PVC DRAIN (DRAIN) ×2 IMPLANT
EVACUATOR SILICONE 100CC (DRAIN) IMPLANT
FACESHIELD LNG OPTICON STERILE (SAFETY) ×10 IMPLANT
FLOSEAL (HEMOSTASIS) ×2 IMPLANT
GAUZE SPONGE 2X2 8PLY STRL LF (GAUZE/BANDAGES/DRESSINGS) ×1 IMPLANT
GLOVE BIOGEL PI IND STRL 7.5 (GLOVE) ×1 IMPLANT
GLOVE BIOGEL PI IND STRL 8 (GLOVE) ×1 IMPLANT
GLOVE BIOGEL PI INDICATOR 7.5 (GLOVE) ×1
GLOVE BIOGEL PI INDICATOR 8 (GLOVE) ×1
GLOVE ECLIPSE 8.0 STRL XLNG CF (GLOVE) IMPLANT
GLOVE ORTHO TXT STRL SZ7.5 (GLOVE) ×4 IMPLANT
GLOVE SURG ORTHO 8.0 STRL STRW (GLOVE) ×2 IMPLANT
GOWN STRL NON-REIN LRG LVL3 (GOWN DISPOSABLE) ×2 IMPLANT
HANDPIECE INTERPULSE COAX TIP (DISPOSABLE) ×1
IMMOBILIZER KNEE 20 (SOFTGOODS)
IMMOBILIZER KNEE 20 THIGH 36 (SOFTGOODS) IMPLANT
KIT BASIN OR (CUSTOM PROCEDURE TRAY) ×2 IMPLANT
MANIFOLD NEPTUNE II (INSTRUMENTS) ×2 IMPLANT
NDL SAFETY ECLIPSE 18X1.5 (NEEDLE) ×1 IMPLANT
NEEDLE HYPO 18GX1.5 SHARP (NEEDLE) ×1
NS IRRIG 1000ML POUR BTL (IV SOLUTION) ×4 IMPLANT
PACK TOTAL JOINT (CUSTOM PROCEDURE TRAY) ×2 IMPLANT
PADDING CAST COTTON 6X4 STRL (CAST SUPPLIES) ×4 IMPLANT
PLATE ROT INSERT 15MM SIZE 4 (Plate) ×2 IMPLANT
POSITIONER SURGICAL ARM (MISCELLANEOUS) ×2 IMPLANT
SET HNDPC FAN SPRY TIP SCT (DISPOSABLE) ×1 IMPLANT
SPONGE GAUZE 2X2 STER 10/PKG (GAUZE/BANDAGES/DRESSINGS) ×1
SPONGE GAUZE 4X4 12PLY (GAUZE/BANDAGES/DRESSINGS) ×4 IMPLANT
SPONGE LAP 18X18 X RAY DECT (DISPOSABLE) IMPLANT
SUCTION FRAZIER 12FR DISP (SUCTIONS) ×2 IMPLANT
SUT ETHILON 2 0 PS N (SUTURE) IMPLANT
SUT MNCRL AB 4-0 PS2 18 (SUTURE) ×2 IMPLANT
SUT VIC AB 1 CT1 36 (SUTURE) ×6 IMPLANT
SUT VIC AB 2-0 CT1 27 (SUTURE) ×3
SUT VIC AB 2-0 CT1 TAPERPNT 27 (SUTURE) ×3 IMPLANT
SWAB COLLECTION DEVICE MRSA (MISCELLANEOUS) IMPLANT
SYR 50ML LL SCALE MARK (SYRINGE) ×2 IMPLANT
SYR CONTROL 10ML LL (SYRINGE) ×2 IMPLANT
TOWEL OR 17X26 10 PK STRL BLUE (TOWEL DISPOSABLE) ×4 IMPLANT
TRAY FOLEY CATH 14FRSI W/METER (CATHETERS) ×2 IMPLANT
TRAY REVISION SZ 3 (Knees) ×2 IMPLANT
TUBE ANAEROBIC SPECIMEN COL (MISCELLANEOUS) ×2 IMPLANT
WATER STERILE IRR 1500ML POUR (IV SOLUTION) ×2 IMPLANT
WRAP KNEE MAXI GEL POST OP (GAUZE/BANDAGES/DRESSINGS) ×2 IMPLANT

## 2011-06-23 NOTE — Anesthesia Procedure Notes (Signed)
Anesthesia Regional Block:  Femoral nerve block  Pre-Anesthetic Checklist: ,, timeout performed, Correct Patient, Correct Site, Correct Laterality, Correct Procedure, Correct Position, site marked, Risks and benefits discussed,  Surgical consent,  Pre-op evaluation,  At surgeon's request and post-op pain management  Laterality: Right  Prep: chloraprep       Needles:   Needle Type: Echogenic Stimulator Needle     Needle Length: 10cm 10 cm Needle Gauge: 21 G    Additional Needles:  Procedures: ultrasound guided Femoral nerve block Narrative:  Start time: 06/23/2011 9:06 AM End time: 06/23/2011 9:16 AM  Performed by: Personally   Femoral nerve block

## 2011-06-23 NOTE — Transfer of Care (Signed)
Immediate Anesthesia Transfer of Care Note  Patient: Dennis Arellano  Procedure(s) Performed:  IRRIGATION AND DEBRIDEMENT KNEE WITH POLY EXCHANGE - Right Knee Open Arthrotomy and Scar Removal/Debridement/Lateral Aspect of Knee with Possible Poly Exchange and Tibial revision tray.  Patient Location: PACU  Anesthesia Type: General  Level of Consciousness: awake, alert , oriented and patient cooperative  Airway & Oxygen Therapy: Patient Spontanous Breathing and Patient connected to nasal cannula oxygen  Post-op Assessment: Report given to PACU RN, Post -op Vital signs reviewed and stable and Patient moving all extremities  Post vital signs: Reviewed and stable  Complications: No apparent anesthesia complications

## 2011-06-23 NOTE — Brief Op Note (Signed)
06/23/2011  11:42 AM  PATIENT:  Dennis Arellano  41 y.o. male  PRE-OPERATIVE DIAGNOSIS:  Right Total Knee Arthroplasty Scar Tissue  POST-OPERATIVE DIAGNOSIS:  Loose tibial tray found at debridement, with excessive scar noted in infrapatellar region and lateral gutter  PROCEDURE:  Procedure(s): Revision Right TKR with scar debridement  SURGEON:  Surgeon(s): Shelda Pal, MD  PHYSICIAN ASSISTANT: Lanney Gins, PA-C   ANESTHESIA:   regional and general  EBL:  Total I/O In: 1000 [I.V.:1000] Out: 350 [Urine:250; Blood:100]  BLOOD ADMINISTERED:none  DRAINS: (1 medium) Hemovact drain(s) in the right knee with  Suction Open   LOCAL MEDICATIONS USED:  MARCAINE 30CC and OTHER toradol, 1 cc  SPECIMEN:  No Specimen  DISPOSITION OF SPECIMEN:  N/A  COUNTS:  YES  TOURNIQUET:   Total Tourniquet Time Documented: Thigh (Right) - 33 minutes  DICTATION: .Other Dictation: Dictation Number [id number]  PLAN OF CARE: Admit to inpatient   PATIENT DISPOSITION:  PACU - hemodynamically stable.   Delay start of Pharmacological VTE agent (>24hrs) due to surgical blood loss or risk of bleeding:  {YES/NO/NOT APPLICABLE:20182

## 2011-06-23 NOTE — Anesthesia Preprocedure Evaluation (Signed)
Anesthesia Evaluation  Patient identified by MRN, date of birth, ID band Patient awake    Reviewed: Allergy & Precautions, H&P , NPO status , Patient's Chart, lab work & pertinent test results, reviewed documented beta blocker date and time   Airway Mallampati: II TM Distance: >3 FB Neck ROM: Full    Dental  (+) Dental Advisory Given   Pulmonary neg pulmonary ROS,  clear to auscultation        Cardiovascular neg cardio ROS Regular Normal Denies cardiac symptoms   Neuro/Psych Negative Neurological ROS  Negative Psych ROS   GI/Hepatic negative GI ROS, Neg liver ROS,   Endo/Other  Morbid obesity  Renal/GU negative Renal ROS  Genitourinary negative   Musculoskeletal negative musculoskeletal ROS (+)   Abdominal   Peds negative pediatric ROS (+)  Hematology negative hematology ROS (+)   Anesthesia Other Findings   Reproductive/Obstetrics negative OB ROS                           Anesthesia Physical Anesthesia Plan  ASA: II  Anesthesia Plan: General   Post-op Pain Management:    Induction: Intravenous  Airway Management Planned: Oral ETT  Additional Equipment:   Intra-op Plan:   Post-operative Plan: Extubation in OR  Informed Consent: I have reviewed the patients History and Physical, chart, labs and discussed the procedure including the risks, benefits and alternatives for the proposed anesthesia with the patient or authorized representative who has indicated his/her understanding and acceptance.     Plan Discussed with: CRNA and Surgeon  Anesthesia Plan Comments:         Anesthesia Quick Evaluation

## 2011-06-23 NOTE — Interval H&P Note (Signed)
History and Physical Interval Note:  06/23/2011 9:07 AM  Dennis Arellano  has presented today for surgery, with the diagnosis of Right Total Knee Arthroplasty Scar Tissue  The various methods of treatment have been discussed with the patient and family. After consideration of risks, benefits and other options for treatment, the patient has consented to  Procedure(s): RIGHT KNEE SCAR DEBRIDEMENT PluS IRRIGATION AND DEBRIDEMENT KNEE WITH POLY EXCHANGE as a surgical intervention .  The patients' history has been reviewed, patient examined, no change in status, stable for surgery.  I have reviewed the patients' chart and labs.  Questions were answered to the patient's satisfaction.     Shelda Pal

## 2011-06-23 NOTE — Anesthesia Postprocedure Evaluation (Signed)
  Anesthesia Post-op Note  Patient: Dennis Arellano  Procedure(s) Performed:  IRRIGATION AND DEBRIDEMENT KNEE WITH POLY EXCHANGE - Right Knee Open Arthrotomy and Scar Removal/Debridement/Lateral Aspect of Knee with Possible Poly Exchange and Tibial revision tray.  Patient Location: PACU  Anesthesia Type: General and GA combined with regional for post-op pain  Level of Consciousness: oriented and sedated  Airway and Oxygen Therapy: Patient Spontanous Breathing and Patient connected to nasal cannula oxygen  Post-op Pain: mild  Post-op Assessment: Post-op Vital signs reviewed, Patient's Cardiovascular Status Stable, Respiratory Function Stable, Patent Airway and No signs of Nausea or vomiting  Post-op Vital Signs: stable  Complications: No apparent anesthesia complications

## 2011-06-24 LAB — CBC
Hemoglobin: 12.8 g/dL — ABNORMAL LOW (ref 13.0–17.0)
MCH: 28 pg (ref 26.0–34.0)
Platelets: 199 10*3/uL (ref 150–400)
RBC: 4.57 MIL/uL (ref 4.22–5.81)
WBC: 9.3 10*3/uL (ref 4.0–10.5)

## 2011-06-24 LAB — BASIC METABOLIC PANEL
CO2: 29 mEq/L (ref 19–32)
Calcium: 8.7 mg/dL (ref 8.4–10.5)
Glucose, Bld: 103 mg/dL — ABNORMAL HIGH (ref 70–99)
Potassium: 4.6 mEq/L (ref 3.5–5.1)
Sodium: 134 mEq/L — ABNORMAL LOW (ref 135–145)

## 2011-06-24 MED ORDER — OXYCODONE HCL 5 MG PO TABS
5.0000 mg | ORAL_TABLET | ORAL | Status: DC
  Administered 2011-06-24 – 2011-06-25 (×12): 15 mg via ORAL
  Filled 2011-06-24 (×12): qty 3

## 2011-06-24 MED ORDER — KETOROLAC TROMETHAMINE 15 MG/ML IJ SOLN
15.0000 mg | Freq: Four times a day (QID) | INTRAMUSCULAR | Status: DC
  Administered 2011-06-24 – 2011-06-25 (×2): 15 mg via INTRAVENOUS
  Filled 2011-06-24 (×6): qty 1

## 2011-06-24 NOTE — Progress Notes (Signed)
Subjective: 1 Day Post-Op Procedure(s) (LRB): IRRIGATION AND DEBRIDEMENT KNEE WITH POLY EXCHANGE (Right)   Patient reports pain as mild. No events.  Objective:   VITALS:   Filed Vitals:   06/24/11 0630  BP: 116/77  Pulse: 74  Temp: 98.1 F (36.7 C)  Resp: 16    Neurovascular intact Dorsiflexion/Plantar flexion intact Incision: dressing C/D/I No cellulitis present Compartment soft  LABS  Basename 06/24/11 0400  HGB 12.8*  HCT 38.6*  WBC 9.3  PLT 199     Basename 06/24/11 0400  NA 134*  K 4.6  BUN 11  CREATININE 0.96  GLUCOSE 103*     Assessment/Plan: 1 Day Post-Op Procedure(s) (LRB): IRRIGATION AND DEBRIDEMENT KNEE WITH POLY EXCHANGE (Right)  Foley cath d/c'ed HV drain d/c'ed Advance diet Up with therapy D/C IV fluids Discharge home with home health upon discharge   Anastasio Auerbach. Kniyah Khun   PAC  06/24/2011, 8:31 AM

## 2011-06-24 NOTE — Op Note (Signed)
Dennis Arellano, Dennis Arellano  MEDICAL RECORD NO.:  [id number]  LOCATION:  1619                         FACILITY:  [facility]  PHYSICIAN:  Dennis Arellano, M.D.  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  POSTOPERATIVE DIAGNOSES/FINDINGS:  Excessive scarring of the infrapatellar region with some associated lateral scarring, however, at the time of debridement the tibial component was noted to have separated quite easily from its cement mantle.  PROCEDURE: 1. Revision right total knee replacement with revision of the tibial     tray to an M.B.T. revision tray size 3 with a 15 mm insert to match     the 4 femur. 2. Sharp excisional debridement of the knee including lateral gutter     scarring in infrapatellar region.  SURGEON:  Dennis Frankel. Charlann Boxer, MD  ASSISTANT:  Dennis Gins, PA-C  Mr. Dennis Gins, PA, was present for the entirety of the case, utilized for preoperative positioning, perioperative retractor and leg management as well as the facilitation of case and primary wound closure.  ANESTHESIA:  General plus preop regional femoral nerve block.  SPECIMENS:  None.  FINDINGS:  The patient was noted to have clear normal synovial joint fluid, no concern for infection preoperative nor perioperative.  DRAINS:  One medium Hemovac.  TOURNIQUET TIME:  32 minutes at 250 mmHg.  INDICATION FOR PROCEDURE:  Dennis Arellano is a 41 year old male with multiple surgical procedures on his right knee.  He had underwent a right total knee replacement for failure of all other measures approximately 3-4 months ago.  He had done very well with regard to obtaining summation work hard with physical therapy.  He had  developed persistent lateral catching on his knee.  Preoperative workup there was negative for any infection, no effusion or warmth.  The planned procedure was discussed was for an excisional debridement of scar tissue and poly exchange to allow for further debridement of his knee.  Risks and benefits, persistent and recurring scarring as well as persistent pain were discussed, in the setting of total knee arthroplasty in a 41 year old male.  Risks of infection, DVT were also discussed and reviewed, consent was obtained.  PROCEDURE IN DETAIL:  The patient was brought to the operative theater. Once adequate anesthesia, preoperative antibiotics, Ancef 2 g administered, in addition to tranexamic IV Tylenol.  He was positioned supine with the right thigh tourniquet placed.  The right lower extremity was then prepped and draped in sterile fashion, with the right foot placed in a De Mayo leg holder.  A  time-out was performed identifying the patient, planned procedure, and extremity.  Leg was exsanguinated, tourniquet elevated to 250 mmHg.  The patient's old incision was identified and excised as it had widened a bit.  Soft tissue planes were created.  At this point, a sharp arthrotomy was made. We used a knife blade, encountering clear yellow synovial fluid.  Following initial arthrotomy,  attention was first directed to the infrapatellar and lateral aspect of the knee.  There was noted to be an abundant scar in the infrapatellar region which was debrided as was the lateral aspect of the patella at this point.  At this point, with a proximal medial peel, I was able to subluxate the tibial component anteriorly and removed the tibial polyethylene insert without difficulty.  The femoral component was protected.  With retractors were placed, I began working on the lateral gutter more. After adjusting the retractor a couple of different times, the tibial tray dislodged from the cement.  This  was unexpected.  Please note that preoperatively, there was no way to assess this.  I am uncertain if it was truly loose, but nonetheless it did dissociate from the cement metal interface earlier than expected.  There was no evidence any fibrous material or concern for infection underneath the tray.  Given the fact this loosened, we changed the game plan.  At this point, I felt that I had performed an adequate debridement as the lateral gutter was completely clear as was the infrapatellar region.  With protection of the femoral component, the tibia was subluxated anteriorly.  Time was now spent at debriding the cement using a 10 ACL saw undermining the cement mantle, cement bone interface then removed the cement.  I then removed the cement with the canal.  I went up to an M.B.T. revision tray to provide better cement mantle fixation to prevent any complications for any early loosening.  A size 3 tibial tray was chosen, again it was pinned into position, drilled for an M.B.T. revision tray.  The size 3 M.B.T. revision tray was then opened.  I did do a trial reduction and decided based on the preoperative assessment as well as now this revision that I will go up to a 15 mm insert to provide the stability from extension to flexion.  I did not use augment.  At this point following further debridements and drilling the proximal femur to allow for cement interdigitation, we irrigated the knee copiously with about a 1000 cc of normal saline solution with pulse lavage.  Cement restrictor was placed into the proximal tibia.  Cement was mixed.  I used the gentamicin impregnated cement and the final component was then chosen, cemented into the tibia.  The knee was brought to extension with a 15 mm insert and extruded cement was removed while cement cured.  Once the cement had fully cured, excess cement was removed throughout the knee, the final size 4 x 15 mm insert was chosen and placed in the  knee.  The tourniquet had been let down at 33 minutes without significant hemostasis.  Medium Hemovac drain was placed deep.  The extensor mechanism was then re-approximated over the Hemovac drain using #1 Vicryl with the knee in flexion.  The remaining of the wound was closed with 2-0 Vicryl, running 4-0 Monocryl.  The knee was cleaned, dried, and dressed sterilely using Dermabond and Aquacel dressing.  Drain site dressed separately.  The patient was brought to recovery room in stable condition, tolerating the procedure well.     Dennis Frankel Charlann Arellano, M.D.     MDO/MEDQ  D:  06/23/2011  T:  06/24/2011  Job:  [id number]

## 2011-06-24 NOTE — Progress Notes (Signed)
  CARE MANAGEMENT NOTE 06/24/2011  Patient:  Dennis Arellano, Dennis Arellano   Account Number:  [id number]  Date Initiated:  06/24/2011  Documentation initiated by:  Colleen Can  Subjective/Objective Assessment:   dx rt total knee arthroplasty scar tissue; revision rt total knee replacemnt with revision of tibial tray, excisional debridemnt of knee     Action/Plan:   CM spoke with patient. Plans are for patient to return to his home in [place], [place] where spouse will be caregiver. Already has RW and handicapped equipped bathroom   Anticipated DC Date:  06/26/2011   Anticipated DC Plan:  HOME W HOME HEALTH SERVICES      DC Planning Services  CM consult      [facility] Choice  HOME HEALTH   Choice offered to / List presented to:  C-1 Patient   DME arranged  NA      DME agency  NA       Status of service:  In process, will continue to follow  Comments:  06/24/2011 [name] BSN CCM [phone] This is a worker's comp case. Contact person Meg [phone]. This case was referred to [name] from MD office. They are currently working on getting authorization via worker's comp. Pt states he will take the [facility] company that is authorized thru his worker's comp

## 2011-06-24 NOTE — Progress Notes (Signed)
OT Screen Order received, chart reviewed. Spoke briefly with pt who stated he has all necessary DME from previous knee sx and prn A at home. Pt presents with no OT needs at this time. Will sign off.  Garrel Ridgel, OTR/L  Pager [phone] 06/24/2011

## 2011-06-24 NOTE — Progress Notes (Signed)
Physical Therapy Treatment Patient Details Name: Dennis Arellano MRN: [id number] DOB: December 24, 1970 Today's Date: 06/24/2011  PM session 13;35 - 14;00 1 gt  1 te  PT Assessment/Plan  PT - Assessment/Plan Comments on Treatment Session: Pt states he has had 9 previous surgeries, very knoledgable.  Plans to D/C to home. PT Plan: Discharge plan remains appropriate Follow Up Recommendations: Home health PT Equipment Recommended: None recommended by PT (has all from previous) PT Goals  Acute Rehab PT Goals PT Goal Formulation: With patient Pt will go Supine/Side to Sit: Independently PT Goal: Supine/Side to Sit - Progress: Progressing toward goal Pt will go Sit to Supine/Side: Independently PT Goal: Sit to Supine/Side - Progress: Progressing toward goal Pt will go Sit to Stand: Independently PT Goal: Sit to Stand - Progress: Progressing toward goal Pt will Ambulate: >150 feet;with modified independence;with rolling walker PT Goal: Ambulate - Progress: Progressing toward goal Pt will Go Up / Down Stairs: 1-2 stairs;with modified independence PT Goal: Up/Down Stairs - Progress: Progressing toward goal Pt will Perform Home Exercise Program: with min assist PT Goal: Perform Home Exercise Program - Progress: Progressing toward goal  PT Treatment Precautions/Restrictions  Precautions Precautions: Knee Precaution Comments: Pt aware of KI use 2nd repeat surgerys Required Braces or Orthoses: Yes Restrictions Weight Bearing Restrictions: No Other Position/Activity Restrictions: WHAT Mobility (including Balance) Bed Mobility Bed Mobility: Yes Supine to Sit:  (MinGuard Assist) Supine to Sit Details (indicate cue type and reason): Min assist to support R LE Sit to Supine: 4: Min assist Sit to Supine - Details (indicate cue type and reason): Min assist back to bed 2nd fatigue Transfers Transfers: Yes Sit to Stand: 6: Modified independent (Device/Increase time);Other (comment) (increased  time) Ambulation/Gait Ambulation/Gait: Yes Ambulation/Gait Assistance: 5: Supervision Ambulation/Gait Assistance Details (indicate cue type and reason): with KI and one VC for safety with backward gait to bed to use the RW completely until all the way back Ambulation Distance (Feet): 110 Feet Assistive device: Rolling walker Gait Pattern: Step-to pattern Stairs: No Wheelchair Mobility Wheelchair Mobility: No    Exercise  Total Joint Exercises Ankle Circles/Pumps: AROM;Both;10 reps Quad Sets: AROM;Both;10 reps Gluteal Sets: AROM;Both;10 reps Towel Squeeze: AROM;Both;10 reps Heel Slides: AAROM;Right;10 reps Hip ABduction/ADduction: AAROM;Right;10 reps Straight Leg Raises: AAROM;Right;10 reps End of Session PT - End of Session Equipment Utilized During Treatment: Gait belt Activity Tolerance: Patient tolerated treatment well Patient left: in bed;with call bell in reach General Behavior During Session: [facility] for tasks performed Cognition: [facility] for tasks performed  Felecia Shelling  PTA [facility]  Acute  Rehab Pager     [phone]

## 2011-06-24 NOTE — Evaluation (Signed)
Physical Therapy Evaluation Patient Details Name: Dennis Arellano MRN: [id number] DOB: 07-Sep-1970 Today's Date: 06/24/2011  9:40-10:10 PT Eval 2  Problem List: There is no problem list on file for this patient.   Past Medical History:  Past Medical History  Diagnosis Date  . Complication of anesthesia 06-19-11    8'12 surgery -hard time to arouse  . Kidney calculi 06-19-11    past hx.  . Arthritis 06-19-11    8'12 RTKA, now with  ?scarring   Past Surgical History:  Past Surgical History  Procedure Date  . Joint replacement 06-19-11    8'12 RTKA, multiple knee scopes prior  . Knee arthroscopy 06-19-11    Right -multiple ,one with bone wedge procedure  . Wisdom tooth extraction 06-19-11    PT Assessment/Plan/Recommendation PT Assessment Clinical Impression Statement: Pt did well with ambulation. Pt tearful due to unexpected high levels of pain.  Expect good progress. PT Recommendation/Assessment: Patient will need skilled PT in the acute care venue PT Problem List: Decreased strength;Decreased range of motion;Pain;Decreased activity tolerance Barriers to Discharge: None PT Therapy Diagnosis : Acute pain;Difficulty walking PT Plan PT Frequency: 7X/week PT Treatment/Interventions: Gait training;Stair training;Therapeutic exercise;Therapeutic activities;Patient/family education PT Recommendation Follow Up Recommendations: Home health PT Equipment Recommended: None recommended by PT PT Goals  Acute Rehab PT Goals PT Goal Formulation: With patient Time For Goal Achievement: 4 days Pt will go Supine/Side to Sit: Independently PT Goal: Supine/Side to Sit - Progress: Goal set today Pt will go Sit to Supine/Side: Independently PT Goal: Sit to Supine/Side - Progress: Goal set today Pt will go Sit to Stand: with modified independence;with upper extremity assist PT Goal: Sit to Stand - Progress: Goal set today Pt will Ambulate: >150 feet;with modified independence;with rolling  walker PT Goal: Ambulate - Progress: Goal set today Pt will Go Up / Down Stairs: 1-2 stairs;with rolling walker;with modified independence PT Goal: Up/Down Stairs - Progress: Goal set today Pt will Perform Home Exercise Program: with min assist PT Goal: Perform Home Exercise Program - Progress: Goal set today  PT Evaluation Precautions/Restrictions  Precautions Required Braces or Orthoses:  (KI not ordered but is in room) Restrictions Weight Bearing Restrictions: No (WBAT) Prior Functioning  Home Living Lives With: Spouse;Son;Daughter Receives Help From: Family Type of Home: House Home Layout: One level Home Access: Stairs to enter Entrance Stairs-Rails: None Entrance Stairs-Number of Steps: 2 Bathroom Toilet: Handicapped height Home Adaptive Equipment: Walker - rolling;Built-in shower seat Prior Function Level of Independence: Independent with basic ADLs;Independent with homemaking with ambulation;Independent with gait;Independent with transfers Able to Take Stairs?: Yes Vocation: On disability Vocation Requirements: Network engineer Cognition Arousal/Alertness: Awake/alert Overall Cognitive Status: Appears within functional limits for tasks assessed Orientation Level: Oriented X4 Sensation/Coordination Sensation Light Touch: Appears Intact Coordination Gross Motor Movements are Fluid and Coordinated: Yes Fine Motor Movements are Fluid and Coordinated: Yes Extremity Assessment RUE Assessment RUE Assessment: Within Functional Limits LUE Assessment LUE Assessment: Within Functional Limits RLE Assessment RLE Assessment: Exceptions to [facility] RLE AROM (degrees) Right Knee Flexion 0-140: 35  RLE Strength RLE Overall Strength: Deficits;Due to pain (SLR -3/5, ankle WNL, knee NT due to pain) LLE Assessment LLE Assessment: Within Functional Limits Mobility (including Balance) Bed Mobility Bed Mobility: Yes Supine to Sit: 4: Min assist;HOB flat;With rails Supine  to Sit Details (indicate cue type and reason): assist for RLE, pt 80% Transfers Transfers: Yes Sit to Stand: 6: Modified independent (Device/Increase time);With upper extremity assist;From bed Stand to Sit: 6: Modified independent (  Device/Increase time);With upper extremity assist;To chair/3-in-1;With armrests Ambulation/Gait Ambulation/Gait: Yes Ambulation/Gait Assistance: 6: Modified independent (Device/Increase time) Ambulation Distance (Feet): 120 Feet Assistive device: Rolling walker Gait Pattern: Step-to pattern    Exercise  Total Joint Exercises Ankle Circles/Pumps: AROM;Both;15 reps;Supine Quad Sets: AROM;Right;10 reps;Supine Heel Slides: AAROM;Right;5 reps;Supine (reps limited by pain) End of Session PT - End of Session Equipment Utilized During Treatment: Gait belt Activity Tolerance: Patient limited by pain Patient left: in chair;with call bell in reach Nurse Communication: Mobility status for transfers;Mobility status for ambulation General Behavior During Session: [facility] for tasks performed Cognition: [facility] for tasks performed  Tamala Ser 06/24/2011, 11:56 AM  Tamala Ser PT 06/24/2011  [phone]

## 2011-06-25 ENCOUNTER — Encounter (HOSPITAL_COMMUNITY): Payer: Self-pay | Admitting: Orthopedic Surgery

## 2011-06-25 MED ORDER — FERROUS SULFATE 325 (65 FE) MG PO TABS: 325.0000 mg | ORAL_TABLET | Freq: Three times a day (TID) | ORAL | Status: DC

## 2011-06-25 MED ORDER — POLYETHYLENE GLYCOL 3350 17 G PO PACK: 17.0000 g | PACK | Freq: Every day | ORAL | Status: AC | PRN

## 2011-06-25 MED ORDER — ACETAMINOPHEN 325 MG PO TABS: 650.0000 mg | ORAL_TABLET | Freq: Four times a day (QID) | ORAL | Status: AC | PRN

## 2011-06-25 MED ORDER — OXYCODONE HCL 5 MG PO TABS: 5.0000 mg | ORAL_TABLET | ORAL | Status: AC

## 2011-06-25 MED ORDER — DSS 100 MG PO CAPS: 100.0000 mg | ORAL_CAPSULE | Freq: Two times a day (BID) | ORAL | Status: AC

## 2011-06-25 MED ORDER — METHOCARBAMOL 500 MG PO TABS: 500.0000 mg | ORAL_TABLET | Freq: Four times a day (QID) | ORAL | Status: AC | PRN

## 2011-06-25 MED ORDER — ASPIRIN EC 325 MG PO TBEC: 325.0000 mg | DELAYED_RELEASE_TABLET | Freq: Two times a day (BID) | ORAL | Status: AC

## 2011-06-25 NOTE — Progress Notes (Signed)
  CARE MANAGEMENT NOTE 06/25/2011  Patient:  Dennis, Arellano   Account Number:  [id number]  Date Initiated:  06/24/2011  Documentation initiated by:  Colleen Can  Subjective/Objective Assessment:   dx rt total knee arthroplasty scar tissue; revision rt total knee replacemnt with revision of tibial tray, excisional debridemnt of knee     Action/Plan:   CM spoke with patient. Plans are for patient to return to his home in [place], [place] where spouse will be caregiver. Already has RW and handicapped equipped bathroom   Anticipated DC Date:  06/25/2011   Anticipated DC Plan:  HOME W HOME HEALTH SERVICES      DC Planning Services  CM consult      [facility] Choice  HOME HEALTH   Choice offered to / List presented to:  C-1 Patient   DME arranged  NA      DME agency  NA     HH arranged  HH-2 PT      [facility] agency  [facility]   Status of service:  Completed, signed off Medicare Important Message given?  NO (If response is "NO", the following Medicare IM given date fields will be blank) Date Medicare IM given:   Date Additional Medicare IM given:    Discharge Disposition:  HOME W HOME HEALTH SERVICES   Comments:  06/25/2011 [name] BSN CCM [phone] [name] received authorization from Worker's Comp to provide HHPT to patient. Pt for discharge today. Services for hhpt will start tomorrow 06/26/11.

## 2011-06-25 NOTE — Progress Notes (Signed)
Subjective: 2 Days Post-Op Procedure(s) (LRB): IRRIGATION AND DEBRIDEMENT KNEE WITH POLY EXCHANGE (Right)   Patient reports pain as mild. No events. Ready to be discharged home with home health.  Objective:   VITALS:   Filed Vitals:   06/25/11 0615  BP: 103/70  Pulse: 78  Temp: 99.1 F (37.3 C)  Resp: 16    Neurovascular intact Dorsiflexion/Plantar flexion intact Incision: dressing C/D/I No cellulitis present Compartment soft  LABS  Basename 06/24/11 0400  HGB 12.8*  HCT 38.6*  WBC 9.3  PLT 199     Basename 06/24/11 0400  NA 134*  K 4.6  BUN 11  CREATININE 0.96  GLUCOSE 103*     Assessment/Plan: 2 Days Post-Op Procedure(s) (LRB): IRRIGATION AND DEBRIDEMENT KNEE WITH POLY EXCHANGE (Right)   Up with therapy Discharge home with home health   Follow-up Information    Follow up with OLIN,Barton Want D in 2 weeks.   Contact information:   [facility] [address] [place] [address] [phone]            Anastasio Auerbach. Dunbar Buras   PAC  06/25/2011, 10:37 AM

## 2011-06-25 NOTE — Progress Notes (Signed)
Physical Therapy Treatment Patient Details Name: BROGEN DUELL MRN: [id number] DOB: 27-Nov-1970 Today's Date: 06/25/2011  R S/P TKR currently I&D w/ poly exchange POD # 2 1-:25 - 10:50 1 gt  1 ta  PT Assessment/Plan  Pt plans to D/C to home today PT Goals   see LPT eval  PT Treatment Precautions/Restrictions  D/C KI pt able to perform SLR WBAT Mobility Assisted pt OOB @ Mod I Amb pt in hallway with RW w/o KI 280' step through gait with good safety cognition. Practiced up/down one step forward with RW at [facility] assist and one initial VC for safety on foot placement End of Session  Pt had no questions/concerns Applied ICE to knee Reported to nurse pt was ready for D/C  Felecia Shelling  PTA [facility]  Acute  Rehab Pager     [phone]

## 2011-06-30 NOTE — Discharge Summary (Signed)
Physician Discharge Summary  Patient ID: Dennis Arellano MRN: [id number] DOB/AGE: [birth year] 39 y.o.  Admit date: 06/23/2011 Discharge date: 06/25/2011  Procedures:  Procedure(s) (LRB): IRRIGATION AND DEBRIDEMENT KNEE WITH POLY EXCHANGE (Right)  Attending Physician: Dr. 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 *  Complication of anesthesia  Kidney calculi  Arthritis   HPI: The patient is a 41 year old white male who states he has had right knee pain for about 3 years. 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 Since that time, he has had significant pain, which has just increased over the years. The patient does state that he did have previous surgeries on that knee with multiple arthroscopies and wedge placement in his knee. After the time of the fall, he has had 3-4 arthroscopies, all of which have been minimally effective. Steroid injections and hyaluronic acid injections also, which were minimally effective at helping the pain. The patient also states that he has had braces and physical therapy. Since all the conservative treatements were ineffective he had a right total knee arthroplasty a couple of month ago. He was doing well at first, but gradually started to have a mechanical click in the knee and started to have some pain on the lateral aspect. Various options were discussed with the patient. The patient wished to proceed with surgery. Risks, benefits, and expectations of the procedure were discussed with the patient. The patient understands the risks, benefits, and expectations, and wishes to proceed with a right total knee revision, with open arthrotomy, poly exchange and scar removal/debridment.  PCP: No primary provider on file.   Discharged Condition: good  Hospital Course:  Patient underwent the above stated  procedure on 06/23/2011. Patient tolerated the procedure well and brought to the recovery room in good condition and subsequently to the floor.  [name] BP: 116/77 ; Pulse: 74 ; Temp: 98.1 F (36.7 C) ; Resp: 16  [name] was removed, as well as the hemovac drain removed. IV was changed to a saline lock. Patient reports pain as mild. No events. Neurovascular intact, dorsiflexion/plantar flexion intact, incision: dressing C/D/I, no cellulitis present and compartment soft.  LABS  Basename  06/24/11 0400   HGB  12.8*   HCT  38.6*    [name]  BP: 103/70 ; Pulse: 78 ; Temp: 99.1 F (37.3 C) ; Resp: 16  Patient reports pain as mild. No events. Ready to be discharged home with home health. Neurovascular intact, dorsiflexion/plantar flexion intact, incision: dressing C/D/I, no cellulitis present and compartment soft.  LABS  No new labs  Discharge Exam: Extremities: Homans sign is negative, no sign of DVT, no edema, redness or tenderness in the calves or thighs and no ulcers, gangrene or trophic changes  Disposition: Home-Health Care Svc     with follow up in 2 weeks  Follow-up Information    Follow up with OLIN,Buddie Marston D in 2 weeks.   Contact information:   [facility] [address], [address] [place] [address] [phone]          Discharge Orders    Future Orders Please Complete By Expires   Call MD / Call 911      Comments:   If you experience chest pain or shortness of breath, CALL 911 and be transported to the hospital emergency room.  If you develope a fever above 101 F, pus (white drainage) or increased drainage or redness at the wound, or calf pain, call your surgeon's office.   Discharge instructions      Comments:   Maintain surgical dressing for 8 days, then replace with gauze and tape. Keep the area dry and clean until follow up. Follow up in 2 weeks at [facility]. Call with any questions or concerns.      Constipation Prevention      Comments:   Drink plenty of fluids.  Prune juice may be helpful.  You may use a stool softener, such as Colace (over the counter) 100 mg twice a day.  Use MiraLax (over the counter) for constipation as needed.   Increase activity slowly as tolerated      Weight Bearing as taught in Physical Therapy      Comments:   Use a walker or crutches as instructed.   Driving restrictions      Comments:   No driving for 4 weeks   TED hose      Comments:   Use stockings (TED hose) for 2 weeks on both leg(s).  You may remove them at night for sleeping.   Change dressing      Comments:   Maintain surgical dressing for 8 days, then change the dressing daily with sterile 4 x 4 inch gauze dressing and tape. Keep the area dry and clean.      Discharge Medication List as of 06/25/2011 11:12 AM    START taking these medications   Details  acetaminophen (TYLENOL) 325 MG tablet Take 2 tablets (650 mg total) by mouth every 6 (six) hours as needed (or Fever >/= 101)., Starting 06/25/2011, Until Thu 06/24/12, No Print    aspirin EC 325 MG tablet Take 1 tablet (325 mg total) by mouth 2 (two) times daily. X 4 weeks, Starting 06/25/2011, Until Sat 07/05/11, No Print    docusate sodium 100 MG CAPS Take 100 mg by mouth 2 (two) times daily., Starting 06/25/2011, Until Sat 07/05/11, No Print    ferrous sulfate (FERROUSUL) 325 (65 FE) MG tablet Take 1 tablet (325 mg total) by mouth 3 (three) times daily with meals., Starting 06/25/2011, Until Thu 06/24/12, No Print    methocarbamol (ROBAXIN) 500 MG tablet Take 1 tablet (500 mg total) by mouth every 6 (six) hours as needed (muscle spasms)., Starting 06/25/2011, Until Sat 07/05/11, No Print    oxyCODONE (OXY IR/ROXICODONE) 5 MG immediate release tablet Take 1-3 tablets (5-15 mg total) by mouth every 3 (three) hours., Starting 06/25/2011, Until Sat 07/05/11, Print    polyethylene glycol (MIRALAX / GLYCOLAX) packet Take 17 g by mouth daily as needed.,  Starting 06/25/2011, Until Sat 06/28/11, No Print          Signed: Anastasio Auerbach. Madelina Sanda   PAC  06/30/2011, 4:50 PM

## 2012-07-29 ENCOUNTER — Ambulatory Visit
Admission: RE | Admit: 2012-07-29 | Discharge: 2012-07-29 | Disposition: A | Discharge status: Home / Self Care | Payer: Worker's Compensation | Source: Ambulatory Visit | Attending: Orthopedic Surgery | Admitting: Orthopedic Surgery

## 2012-07-29 ENCOUNTER — Other Ambulatory Visit: Payer: Self-pay | Admitting: Orthopedic Surgery

## 2012-07-29 DIAGNOSIS — M2170 Unequal limb length (acquired), unspecified site: Secondary | ICD-10-CM

## 2012-09-15 ENCOUNTER — Emergency Department (HOSPITAL_COMMUNITY)
Admission: EM | Admit: 2012-09-15 | Discharge: 2012-09-15 | Disposition: A | Discharge status: Home / Self Care | Payer: BC Managed Care – PPO | Attending: Emergency Medicine | Admitting: Emergency Medicine

## 2012-09-15 ENCOUNTER — Encounter (HOSPITAL_COMMUNITY): Payer: Self-pay | Admitting: Emergency Medicine

## 2012-09-15 DIAGNOSIS — Z96659 Presence of unspecified artificial knee joint: Secondary | ICD-10-CM | POA: Insufficient documentation

## 2012-09-15 DIAGNOSIS — Z87442 Personal history of urinary calculi: Secondary | ICD-10-CM | POA: Insufficient documentation

## 2012-09-15 DIAGNOSIS — Z79899 Other long term (current) drug therapy: Secondary | ICD-10-CM | POA: Insufficient documentation

## 2012-09-15 DIAGNOSIS — R259 Unspecified abnormal involuntary movements: Secondary | ICD-10-CM | POA: Insufficient documentation

## 2012-09-15 DIAGNOSIS — M62838 Other muscle spasm: Secondary | ICD-10-CM

## 2012-09-15 DIAGNOSIS — Z8739 Personal history of other diseases of the musculoskeletal system and connective tissue: Secondary | ICD-10-CM | POA: Insufficient documentation

## 2012-09-15 LAB — BASIC METABOLIC PANEL
Calcium: 9.9 mg/dL (ref 8.4–10.5)
GFR calc Af Amer: >90 mL/min (ref >90)
GFR calc non Af Amer: >90 mL/min (ref >90)
Potassium: 3.8 mEq/L (ref 3.5–5.1)
Sodium: 137 mEq/L (ref 135–145)

## 2012-09-15 LAB — CBC WITH DIFFERENTIAL/PLATELET
Basophils Absolute: 0 10*3/uL (ref 0.0–0.1)
Eosinophils Absolute: 0.1 10*3/uL (ref 0.0–0.7)
Eosinophils Relative: 1 % (ref 0–5)
Lymphocytes Relative: 20 % (ref 12–46)
Lymphs Abs: 1.9 10*3/uL (ref 0.7–4.0)
MCH: 29.7 pg (ref 26.0–34.0)
MCV: 83.3 fL (ref 78.0–100.0)
Neutrophils Relative %: 75 % (ref 43–77)
Platelets: 236 10*3/uL (ref 150–400)
RBC: 5.08 MIL/uL (ref 4.22–5.81)
RDW: 12.9 % (ref 11.5–15.5)
WBC: 9.3 10*3/uL (ref 4.0–10.5)

## 2012-09-15 LAB — ETHANOL: Alcohol, Ethyl (B): <11 mg/dL (ref 0–11)

## 2012-09-15 LAB — CK: Total CK: 47 U/L (ref 7–232)

## 2012-09-15 MED ORDER — SODIUM CHLORIDE 0.9 % IV BOLUS (SEPSIS)
1000.0000 mL | Freq: Once | INTRAVENOUS | Status: AC
  Administered 2012-09-15: 1000 mL via INTRAVENOUS

## 2012-09-15 MED ORDER — DIAZEPAM 5 MG/ML IJ SOLN
5.0000 mg | Freq: Once | INTRAMUSCULAR | Status: AC
  Administered 2012-09-15: 5 mg via INTRAVENOUS
  Filled 2012-09-15: qty 2

## 2012-09-15 MED ORDER — DIAZEPAM 5 MG PO TABS
5.0000 mg | ORAL_TABLET | Freq: Once | ORAL | Status: AC
  Administered 2012-09-15: 5 mg via ORAL
  Filled 2012-09-15: qty 1

## 2012-09-15 MED ORDER — DIAZEPAM 5 MG PO TABS: 5.0000 mg | ORAL_TABLET | Freq: Four times a day (QID) | ORAL | Status: AC | PRN

## 2012-09-15 NOTE — ED Notes (Signed)
[id number]<YQ> Expected date:<BR> Expected time:<BR> Means of arrival:Ambulance<BR> Comments:<BR> 41yom- Muscle spams/ from orthopedics

## 2012-09-15 NOTE — ED Provider Notes (Signed)
History     CSN: [id number]  Arrival date & time 09/15/12  1240   First MD Initiated Contact with Patient 09/15/12 1249      Chief Complaint  Patient presents with  . Spasms    (Consider location/radiation/quality/duration/timing/severity/associated sxs/prior treatment) The history is provided by the patient.  Dennis Arellano is a 42 y.o. male history of severe arthritis status post right knee replacement here with diffuse spasms. Diffuse spasms since yesterday. He said he has a history of spasms this is worse than usual. He was at the orthopedic office and was sent here for evaluation. As per patient he was seen there and was told to have to followup in the future regarding his knee replacement. Denies any new falls or back pains or urinary symptoms. Denies any drug or alcohol use.    Past Medical History  Diagnosis Date  . Complication of anesthesia 06-19-11    8'12 surgery -hard time to arouse  . Kidney calculi 06-19-11    past hx.  . Arthritis 06-19-11    8'12 RTKA, now with  ?scarring    Past Surgical History  Procedure Laterality Date  . Joint replacement  06-19-11    8'12 RTKA, multiple knee scopes prior  . Knee arthroscopy  06-19-11    Right -multiple ,one with bone wedge procedure  . Wisdom tooth extraction  06-19-11  . I&d knee with poly exchange  06/23/2011    Procedure: IRRIGATION AND DEBRIDEMENT KNEE WITH POLY EXCHANGE;  Surgeon: Shelda Pal, MD;  Location: [facility];  Service: Orthopedics;  Laterality: Right;  Right Knee Open Arthrotomy and Scar Removal/Debridement/Lateral Aspect of Knee with Possible Poly Exchange and Tibial revision tray.    No family history on file.  History  Substance Use Topics  . Smoking status: Never Smoker   . Smokeless tobacco: Not on file  . Alcohol Use: No      Review of Systems  Neurological: Positive for tremors.  All other systems reviewed and are negative.    Allergies  Peanut-containing drug products  Home Medications    Current Outpatient Rx  Name  Route  Sig  Dispense  Refill  . fluPHENAZine (PROLIXIN) 10 MG tablet   Oral   Take 10 mg by mouth daily.           BP 128/74  Pulse 90  Temp(Src) 98 F (36.7 C) (Oral)  Resp 12  SpO2 95%  Physical Exam  Nursing note and vitals reviewed. Constitutional: He is oriented to person, place, and time. He appears well-developed and well-nourished.  Bilateral leg shakes, no arm tremors, slightly anxious   HENT:  Head: Normocephalic.  Mouth/Throat: Oropharynx is clear and moist.  Eyes: Conjunctivae are normal. Pupils are equal, round, and reactive to light.  Neck: Normal range of motion. Neck supple.  Cardiovascular: Regular rhythm and normal heart sounds.   Slightly tachycardic   Pulmonary/Chest: Effort normal and breath sounds normal. No respiratory distress. He has no wheezes. He has no rales.  Abdominal: Soft. Bowel sounds are normal. He exhibits no distension. There is no tenderness. There is no rebound and no guarding.  Musculoskeletal: Normal range of motion. He exhibits no edema and no tenderness.  R knee replacement with nl ROM. Unintentional L leg > R leg tremors. 2+ pulses   Neurological: He is alert and oriented to person, place, and time.  No pronator drift. No upper extremity tremors. Nl strength and sensation throughout   Skin: Skin is warm and  dry.  Psychiatric: He has a normal mood and affect. His behavior is normal. Judgment and thought content normal.    ED Course  Procedures (including critical care time)  Labs Reviewed  BASIC METABOLIC PANEL - Abnormal; Notable for the following:    Glucose, Bld 100 (*)    All other components within normal limits  CBC WITH DIFFERENTIAL  CK  ETHANOL   No results found.   No diagnosis found.    MDM  Dennis Arellano is a 42 y.o. male here with tremors and muscle spasms. Will give valium for spasms. Will check CK level and give IVF.   2:51 PM Felt better after valium, tremors improved.  CK nl. Will d/c home on valium prn spasms.        Dennis Canal, MD 09/15/12 [phone]

## 2012-09-15 NOTE — ED Notes (Signed)
PER EMS- pt picked up from [facility] with c/o muscle spasms all over.  Pt has hx of r knee replacement, was being seen for a check.up.  CBG 125.  Pt is alert and oriented, denies pain.

## 2012-09-15 NOTE — Progress Notes (Signed)
During [facility] ED 09/16/12 visit CM spoke with pt who confirms self pay [facility] resident with no pcp. CM discussed and provided written information for self pay pcps, importance of pcp for f/u care, www.needymeds.org, discounted pharmacies and other [employer] such as financial assistance, DSS and  health department Pt voiced understanding and appreciation of resources provided  Information provided includes   [facility] [address], [address] [place], [place] [address] [phone]  Location: [place] miles from [place] [facility] was started in 1998 to provide quality health care to the working poor in [place].   [facility] [address], [address] [place], [place] - [address] [phone]  Location: [place] miles from [facility]. Office Hours: Monday through Thursday 8 a.m. to 5 p.m. Patients are seen by appointment on Tuesday, Wednesday and Thursday. Eligibility appointments are scheduled on Mondays   [facility] [place] [phone] [place], [place] [address] [phone] Location: 24[place]98 miles from [place]

## 2014-01-19 IMAGING — CT CT BONE LENGTH
1 series · 1 of 1 positions shown · non-contrast
Comparison: None.

CLINICAL DATA: Leg length discrepancy.

CT SCANOGRAM WITHOUT CONTRAST
TECHNIQUE: CT scanogram was performed for measuring leg length.

[Series 1: scanogram · coronal · 1100.5mm · 0.57mm/px · 1 of 1 slices shown]
[im 1/1]
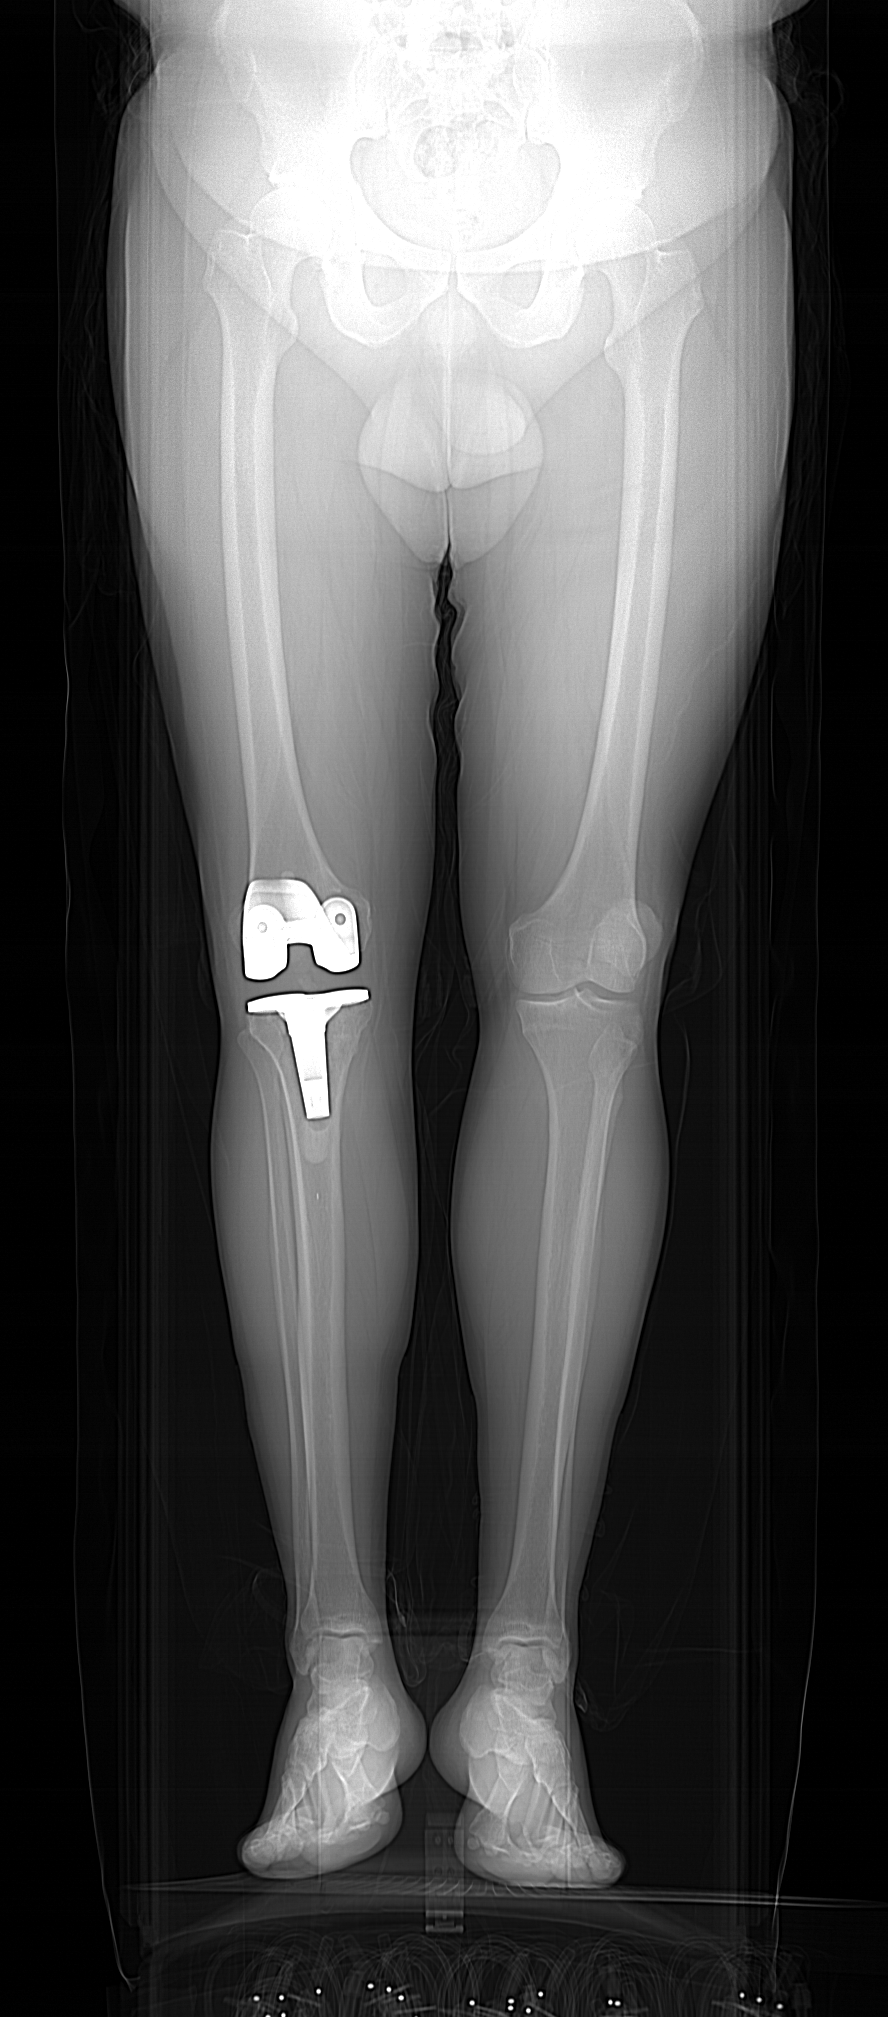

[1 of 1 positions shown; findings below may reference images not displayed]

FINDINGS: A right lower extremity measured from the top of the
femoral head to the tibial plateau measures 78.7 cm.  The left
lower extremity measures 78.6 cm.  A right total knee arthroplasty
is noted.
IMPRESSION: No leg length discrepancy.

## 2014-11-27 ENCOUNTER — Other Ambulatory Visit: Payer: Self-pay | Admitting: Family Medicine

## 2014-11-27 NOTE — Telephone Encounter (Signed)
Refill request for lisinopril.  Pt is not our patient nor is that medication in his chart.  Medication declined.
